# Patient Record
Sex: Female | Born: 1980 | Race: Black or African American | Hispanic: No | Marital: Single | Smoking: Current every day smoker
Health system: Southern US, Community
[De-identification: ages and names within clinical notes are randomized; demographics above are authoritative.]

## PROBLEM LIST (undated history)

## (undated) DIAGNOSIS — K859 Acute pancreatitis without necrosis or infection, unspecified: Secondary | ICD-10-CM

## (undated) DIAGNOSIS — J939 Pneumothorax, unspecified: Secondary | ICD-10-CM

## (undated) HISTORY — PX: LUNG SURGERY: SHX703

---

## 2004-10-23 ENCOUNTER — Observation Stay: Payer: Self-pay | Admitting: Obstetrics & Gynecology

## 2004-11-09 ENCOUNTER — Inpatient Hospital Stay: Payer: Self-pay

## 2005-03-20 ENCOUNTER — Emergency Department: Payer: Self-pay | Admitting: General Practice

## 2006-02-16 ENCOUNTER — Emergency Department: Payer: Self-pay | Admitting: General Practice

## 2008-07-17 ENCOUNTER — Encounter: Payer: Self-pay | Admitting: Maternal & Fetal Medicine

## 2008-07-31 ENCOUNTER — Encounter: Payer: Self-pay | Admitting: Maternal and Fetal Medicine

## 2008-10-23 ENCOUNTER — Encounter: Payer: Self-pay | Admitting: Maternal & Fetal Medicine

## 2008-10-26 ENCOUNTER — Encounter: Payer: Self-pay | Admitting: Maternal & Fetal Medicine

## 2008-10-30 ENCOUNTER — Encounter: Payer: Self-pay | Admitting: Obstetrics & Gynecology

## 2008-11-06 ENCOUNTER — Encounter: Payer: Self-pay | Admitting: Obstetrics & Gynecology

## 2008-11-09 ENCOUNTER — Observation Stay: Payer: Self-pay

## 2008-11-11 ENCOUNTER — Inpatient Hospital Stay: Payer: Self-pay | Admitting: Unknown Physician Specialty

## 2010-06-17 ENCOUNTER — Emergency Department: Payer: Self-pay | Admitting: Emergency Medicine

## 2011-01-10 ENCOUNTER — Ambulatory Visit: Payer: Self-pay | Admitting: Family Medicine

## 2011-04-22 ENCOUNTER — Emergency Department: Payer: Self-pay | Admitting: Emergency Medicine

## 2011-08-14 ENCOUNTER — Encounter: Payer: Self-pay | Admitting: Obstetrics & Gynecology

## 2011-09-25 ENCOUNTER — Encounter: Payer: Self-pay | Admitting: Obstetrics and Gynecology

## 2011-12-12 ENCOUNTER — Inpatient Hospital Stay: Payer: Self-pay

## 2011-12-12 LAB — CBC WITH DIFFERENTIAL/PLATELET
Basophil #: 0 10*3/uL (ref 0.0–0.1)
Basophil %: 0.2 %
Eosinophil #: 0.1 10*3/uL (ref 0.0–0.7)
Eosinophil %: 0.5 %
HCT: 34.8 % — ABNORMAL LOW (ref 35.0–47.0)
HGB: 11.8 g/dL — ABNORMAL LOW (ref 12.0–16.0)
Lymphocyte #: 2 10*3/uL (ref 1.0–3.6)
Lymphocyte %: 8.9 %
MCH: 32.6 pg (ref 26.0–34.0)
MCHC: 33.9 g/dL (ref 32.0–36.0)
MCV: 96 fL (ref 80–100)
Monocyte #: 0.8 10*3/uL — ABNORMAL HIGH (ref 0.0–0.7)
Monocyte %: 3.8 %
Neutrophil #: 19.2 10*3/uL — ABNORMAL HIGH (ref 1.4–6.5)
Neutrophil %: 86.6 %
Platelet: 403 10*3/uL (ref 150–440)
RBC: 3.61 10*6/uL — ABNORMAL LOW (ref 3.80–5.20)
RDW: 13.3 % (ref 11.5–14.5)
WBC: 22.2 10*3/uL — ABNORMAL HIGH (ref 3.6–11.0)

## 2011-12-13 LAB — HEMATOCRIT: HCT: 28.8 % — ABNORMAL LOW (ref 35.0–47.0)

## 2011-12-16 LAB — PATHOLOGY REPORT

## 2012-05-01 ENCOUNTER — Emergency Department: Payer: Self-pay | Admitting: Emergency Medicine

## 2012-05-01 LAB — CBC
HCT: 38.3 % (ref 35.0–47.0)
MCH: 30.5 pg (ref 26.0–34.0)
MCHC: 33.1 g/dL (ref 32.0–36.0)
MCV: 92 fL (ref 80–100)
Platelet: 362 10*3/uL (ref 150–440)
RDW: 13.9 % (ref 11.5–14.5)

## 2012-05-01 LAB — COMPREHENSIVE METABOLIC PANEL
Albumin: 3.7 g/dL (ref 3.4–5.0)
Anion Gap: 5 — ABNORMAL LOW (ref 7–16)
Bilirubin,Total: 0.3 mg/dL (ref 0.2–1.0)
Chloride: 109 mmol/L — ABNORMAL HIGH (ref 98–107)
Co2: 25 mmol/L (ref 21–32)
EGFR (African American): >60
Osmolality: 279 (ref 275–301)
Potassium: 4 mmol/L (ref 3.5–5.1)
SGOT(AST): 20 U/L (ref 15–37)
SGPT (ALT): 29 U/L (ref 12–78)
Sodium: 139 mmol/L (ref 136–145)
Total Protein: 7.7 g/dL (ref 6.4–8.2)

## 2012-05-01 LAB — URINALYSIS, COMPLETE
Bacteria: NONE SEEN
Bilirubin,UR: NEGATIVE
Blood: NEGATIVE
Glucose,UR: NEGATIVE mg/dL (ref 0–75)
Ketone: NEGATIVE
Leukocyte Esterase: NEGATIVE
Nitrite: NEGATIVE
Ph: 7 (ref 4.5–8.0)
RBC,UR: 1 /HPF (ref 0–5)
Squamous Epithelial: 1
WBC UR: 1 /HPF (ref 0–5)

## 2012-10-31 ENCOUNTER — Inpatient Hospital Stay: Payer: Self-pay | Admitting: Cardiothoracic Surgery

## 2012-10-31 LAB — BASIC METABOLIC PANEL
Anion Gap: 5 — ABNORMAL LOW (ref 7–16)
BUN: 9 mg/dL (ref 7–18)
Chloride: 110 mmol/L — ABNORMAL HIGH (ref 98–107)
EGFR (African American): >60
Osmolality: 281 (ref 275–301)
Potassium: 3.9 mmol/L (ref 3.5–5.1)
Sodium: 142 mmol/L (ref 136–145)

## 2012-10-31 LAB — CBC
HGB: 12.5 g/dL (ref 12.0–16.0)
MCH: 30.2 pg (ref 26.0–34.0)
MCV: 95 fL (ref 80–100)
RBC: 4.14 10*6/uL (ref 3.80–5.20)

## 2012-10-31 LAB — TROPONIN I: Troponin-I: <0.02 ng/mL

## 2012-11-05 ENCOUNTER — Ambulatory Visit: Payer: Self-pay | Admitting: Cardiothoracic Surgery

## 2012-11-08 ENCOUNTER — Ambulatory Visit: Payer: Self-pay | Admitting: Cardiothoracic Surgery

## 2012-11-10 ENCOUNTER — Ambulatory Visit: Payer: Self-pay | Admitting: Cardiothoracic Surgery

## 2012-11-12 LAB — CBC
HCT: 39.5 % (ref 35.0–47.0)
HGB: 13.7 g/dL (ref 12.0–16.0)
MCH: 32.7 pg (ref 26.0–34.0)
MCHC: 34.8 g/dL (ref 32.0–36.0)
MCV: 94 fL (ref 80–100)
RBC: 4.21 10*6/uL (ref 3.80–5.20)
RDW: 13 % (ref 11.5–14.5)
WBC: 9.9 10*3/uL (ref 3.6–11.0)

## 2012-11-12 LAB — CK TOTAL AND CKMB (NOT AT ARMC): CK-MB: <0.5 ng/mL — ABNORMAL LOW (ref 0.5–3.6)

## 2012-11-12 LAB — COMPREHENSIVE METABOLIC PANEL
Alkaline Phosphatase: 85 U/L (ref 50–136)
Anion Gap: 4 — ABNORMAL LOW (ref 7–16)
BUN: 13 mg/dL (ref 7–18)
Bilirubin,Total: 0.5 mg/dL (ref 0.2–1.0)
Chloride: 108 mmol/L — ABNORMAL HIGH (ref 98–107)
Co2: 26 mmol/L (ref 21–32)
Creatinine: 0.94 mg/dL (ref 0.60–1.30)
Glucose: 73 mg/dL (ref 65–99)
Potassium: 4.3 mmol/L (ref 3.5–5.1)
SGOT(AST): 15 U/L (ref 15–37)
SGPT (ALT): 21 U/L (ref 12–78)
Sodium: 138 mmol/L (ref 136–145)
Total Protein: 8.5 g/dL — ABNORMAL HIGH (ref 6.4–8.2)

## 2012-11-13 ENCOUNTER — Inpatient Hospital Stay: Payer: Self-pay | Admitting: Surgery

## 2012-11-16 LAB — BASIC METABOLIC PANEL
Anion Gap: 7 (ref 7–16)
BUN: 5 mg/dL — ABNORMAL LOW (ref 7–18)
Calcium, Total: 7.8 mg/dL — ABNORMAL LOW (ref 8.5–10.1)
Chloride: 103 mmol/L (ref 98–107)
Co2: 26 mmol/L (ref 21–32)
Creatinine: 0.82 mg/dL (ref 0.60–1.30)
EGFR (African American): >60
EGFR (Non-African Amer.): >60
Glucose: 196 mg/dL — ABNORMAL HIGH (ref 65–99)
Sodium: 136 mmol/L (ref 136–145)

## 2012-11-16 LAB — CBC WITH DIFFERENTIAL/PLATELET
Basophil #: 0 10*3/uL (ref 0.0–0.1)
Basophil %: 0.4 %
Eosinophil %: 3.5 %
HCT: 33.4 % — ABNORMAL LOW (ref 35.0–47.0)
HGB: 11.3 g/dL — ABNORMAL LOW (ref 12.0–16.0)
MCH: 32 pg (ref 26.0–34.0)
MCV: 95 fL (ref 80–100)
Monocyte #: 0.8 x10 3/mm (ref 0.2–0.9)
Monocyte %: 7.8 %
Neutrophil %: 67.8 %

## 2012-11-17 LAB — URINE CULTURE

## 2012-11-19 LAB — PATHOLOGY REPORT

## 2012-11-22 ENCOUNTER — Ambulatory Visit: Payer: Self-pay | Admitting: Cardiothoracic Surgery

## 2012-12-27 ENCOUNTER — Ambulatory Visit: Payer: Self-pay | Admitting: Cardiothoracic Surgery

## 2014-12-29 NOTE — Op Note (Signed)
PATIENT NAME:  Maureen [name], Maureen [name] MR#:  [id number] DATE OF BIRTH:  May 05, 1981  DATE OF PROCEDURE:  10/31/2012  PREOPERATIVE DIAGNOSIS: Spontaneous right pneumothorax.   POSTOPERATIVE DIAGNOSIS:  Spontaneous right pneumothorax.    SURGEON: Claude MangesWilliam F Lilou Kneip, M.D.   ANESTHESIA: 10 mg IV morphine and 2 mL of lidocaine with epinephrine local.   PROCEDURE PERFORMED:  Closed right thoracostomy tube placement (9.0 JamaicaFrench).   PROCEDURE IN DETAIL: The patient was prepped and draped in the usual sterile fashion. A small incision was made over the seventh rib and the subcutaneous tunnel was created to the superior border of that rib in the anterior axillary line and closed thoracostomy tube was placed into the pleura and guided superiorly to a point where it was just a centimeter or 2 below where the patient experienced some shoulder and high back pain. It was sutured in place with a 0 silk suture and placed to 20 cm water suction and as the patient coughed, she re-expanded her lung as determined by postoperative chest x-ray. There were no complications.    ____________________________ Claude MangesWilliam F. Annasofia Pohl, MD wfm:cc D: 10/31/2012 17:21:25 ET T: 10/31/2012 20:52:44 ET JOB#: [id number]  cc: Claude MangesWilliam F. Shalae Belmonte, MD, <Dictator> Claude MangesWILLIAM F Xavian Hardcastle MD ELECTRONICALLY SIGNED 11/08/2012 20:45

## 2014-12-29 NOTE — H&P (Signed)
PATIENT NAME:  Maureen [name], Maureen [name] MR#:  [id number] DATE OF BIRTH:  05-24-1981  DATE OF ADMISSION:  10/31/2012  HISTORY OF PRESENT ILLNESS:  The patient is a 34 year old black female who was walking back from the store this afternoon and noticed right-sided chest pain and acute shortness of breath. She did not have any other symptoms including hemoptysis or fever.   PAST MEDICAL HISTORY:  No medical illnesses.   MEDICATIONS:  None.   ALLERGIES:  None.   PAST SURGICAL HISTORY:  C-section.   SOCIAL HISTORY:  The patient is single and lives at home with her 2 children. She has a boyfriend. 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 She does not drink any appreciable alcohol.   FAMILY HISTORY:  Noncontributory.   REVIEW OF SYSTEMS:  Negative for 10 systems except as mentioned in the history of present illness above.   PHYSICAL EXAMINATION:   GENERAL: Reveals a skinny young black female lying comfortably on the Emergency Department stretcher. Height 5 feet 5 inches, weight 135 pounds, BMI 22.5.  VITAL SIGNS: Temperature 98.5, pulse 88, respirations 18, blood pressure 117/81, oxygen saturation 97% on room air at rest.  HEENT: Pupils equally round and reactive to light. Extraocular movements intact. Sclerae anicteric. Oropharynx clear. Mucous membranes moist.  NECK: Supple with no tracheal deviation or jugular venous distention and there is no lymphadenopathy.  HEART: Regular rate and rhythm with no murmurs or rubs.  LUNGS: Diminished breath sounds on the right side and the left side is clear.  ABDOMEN: Soft, nontender, nondistended with no palpable hepatosplenomegaly or other masses.  EXTREMITIES: No edema with normal capillary refill bilaterally.  NEUROLOGIC: Cranial nerves II through XII, motor and sensation grossly intact.  PSYCHIATRIC: Alert and oriented x 4. Appropriate affect.   OTHER STUDIES:  CBC and electrolytes and troponin all normal. Chest x-ray  reviewed by me reveals a near total right pneumothorax with no rib fracture or any significant pleural fluid. There is no mediastinal shift.   ASSESSMENT:  Spontaneous right pneumothorax.   PLAN:  Closed thoracostomy tube placement and admission to the hospital.    ____________________________ Claude MangesWilliam F. Leesa Leifheit, MD wfm:si D: 10/31/2012 17:19:14 ET T: 10/31/2012 18:12:32 ET JOB#: [id number]  cc: Claude MangesWilliam F. Shandora Koogler, MD, <Dictator> Claude MangesWILLIAM F Lannie Yusuf MD ELECTRONICALLY SIGNED 11/08/2012 20:44

## 2014-12-29 NOTE — Discharge Summary (Signed)
PATIENT NAME:  Maureen DeltonFLUELLING, Juanette MR#:  [id number] DATE OF BIRTH:  Mar 21, 1981  DATE OF ADMISSION:  10/31/2012 DATE OF DISCHARGE:  11/03/2012  ADMITTING DIAGNOSIS:  Right-sided spontaneous pneumothorax.   DISCHARGE DIAGNOSIS:  Right-sided spontaneous pneumothorax.  OPERATION PERFORMED:  Insertion of right-sided chest tube.   HOSPITAL COURSE: Ms. Buelah ManisFluelling is a 34 year old African American woman who is in her usual state of health when she experienced the acute onset of severe right-sided chest pain and shortness of breath. She was brought to the Emergency Department where a large pneumothorax was identified and was managed with a percutaneous chest tube. The patient did have an air leak for the first day but on the 25th of February she did not have an air leak and her chest tube was placed to water seal. On the 26th of February, her chest tube was removed and a follow-up x-ray showed no evidence of pneumothorax.   The patient was not taking any medications at the time of admission to the hospital. She was given a prescription for Percocet 1 to 2 tablets every 4 to 6 hours as needed for pain, number 20.  She will return to see me in 1 week. She will remove her dressings in 48 hours and cleanse over the incision with soap and water.    ____________________________ Sheppard Plumberimothy E. Thelma Bargeaks, MD teo:ce D: 11/03/2012 16:34:30 ET T: 11/03/2012 18:11:56 ET JOB#: [id number]  cc: Marcial Pacasimothy E. Thelma Bargeaks, MD, <Dictator> Jasmine DecemberIMOTHY E Nyaisha Simao MD ELECTRONICALLY SIGNED 11/04/2012 17:36

## 2014-12-29 NOTE — Op Note (Signed)
PATIENT NAME:  Maureen CantorFLUELLING, Yi A MR#:  [id number] DATE OF BIRTH:  18-Mar-1981  DATE OF PROCEDURE:  11/15/2012  SURGEON: Dr. Hulda Marinimothy Laryn Venning.   ASSISTANT: Dr. Natale LayMark Bird.  PREOPERATIVE DIAGNOSIS: Recurrent pneumothorax.   POSTOPERATIVE DIAGNOSIS: Recurrent pneumothorax.   OPERATION PERFORMED:  1.  Preoperative bronchoscopy to assess endobronchial anatomy.  2.  Right thoracoscopy with wedge resection right upper lobe, inferior aspect right upper lobe and superior segment right lower lobe.   3.  Mechanical pleurodesis.   INDICATIONS FOR PROCEDURE: This patient is a 34 year old woman, who presented to the Emergency Department with a spontaneous pneumothorax several weeks ago. She was managed nonoperatively, but came in several days ago with another pneumothorax. The indications and risks of the above-named procedures were explained to the patient, who gave her informed consent.   DESCRIPTION OF PROCEDURE: The patient was brought to the operating suite and placed in the supine position. General endotracheal anesthesia was given through a double-lumen tube. Preoperative bronchoscopy was carried out. This was normal to the subsegmental levels bilaterally. The patient was then turned for a right thoracoscopy. All pressure points were carefully padded. The patient was prepped and draped in the usual sterile fashion. The previous chest tube had been removed. The chest was entered through an incision made anteriorly just behind the right breast. This was deepened down through the muscles of the chest wall until the pleural space was entered. Once the pleural space was entered, we could then place the scope and at placed our other 2 incisions under direct visualization. The camera port was the most inferior and posterior to the tip of the scapula another port was created. Through these 3 working ports, we were able to examine the lung in its entirety. Of note, is that there was no evidence of any diaphragmatic  defects. There was no purulence in the chest. There were some scattered adhesions to the apex of the chest, which appeared to be from her prior chest tube. Once the lung was completely freed up, we then looked. At the very apex of the chest, there were some adhesions. Whether these were from the chest tube or from ruptured blebs was unclear, but this area was excised using a stapler. We did find another bleb on the inferior aspect of the right lower lobe at its junction with the right middle lobe and we found a third area in the superior segment of the right lower lobe. All of these were removed with an endoscopic stapler.  We then used a Bovie scratch pad to perform a mechanical pleurodesis. The chest was then irrigated and hemostasis was complete. A single chest tube was inserted through the most inferior port and brought out through a separate stab wound inferiorly. The tube was secured to the skin with silk. The 3 port sites were then closed with 0 Vicryl on the muscle, 2-0 Vicryl on the subcutaneous layers and nylon on the skin. The patient was awakened from general endotracheal anesthesia where she was then taken to the recovery room after extubation.   ____________________________ Sheppard Plumberimothy E. Thelma Bargeaks, MD teo:aw D: 11/15/2012 17:16:29 ET T: 11/16/2012 09:16:49 ET JOB#: [id number]  cc: Marcial Pacasimothy E. Thelma Bargeaks, MD, <Dictator> Jasmine DecemberIMOTHY E Nyjah Denio MD ELECTRONICALLY SIGNED 11/22/2012 7:53

## 2014-12-29 NOTE — Discharge Summary (Signed)
PATIENT NAME:  Maureen [name], Maureen [name] MR#:  [id number] DATE OF BIRTH:  [birth year]  DATE OF ADMISSION:  11/13/2012 DATE OF DISCHARGE:  11/18/2012  BRIEF HISTORY:  The patient is [name] 34 year old woman seen in the Emergency Room with chest pain and mild shortness of breath. Evaluation in the Emergency Room suggested [name] right-sided pneumothorax. She had been discharged from the hospital on February 26th following [name] 4-day admission for spontaneous right pneumothorax. Her symptoms recurred and she was brought back to the hospital. She had been treated with [name] 9 French chest tube and suction which seemed to solve the problem, but her symptoms have recurred. [name] large chest tube was placed on the late evening of March 8th using [name] 20 [place]French tube. The following day, she had minimal air leak, but almost complete reexpansion. Dr. Westley Gamblesim Oaks saw the patient on March 10th and felt the patient would benefit from pleurodesis and possible wedge resection. The patient was taken to surgery on March 10th where she underwent thoracoscopy, wedge resection and mechanical pleurodesis. Her symptoms improved over the next 24 hours and discharged home on March 13th to be followed in the office in 7 to 10 days' time. Bathing, activity and driving instructions were given to the patient.   DISCHARGE MEDICATIONS:  Percocet 5/325 mg 1 to 2 tablets every 4 to 6 hours p.r.n.   FINAL DISCHARGE DIAGNOSIS:  Recurrent pneumothorax.   SURGERY:  Thoracoscopy, wedge resection and pleurodesis.    ____________________________ Carmie Endalph L. Ely III, MD rle:si D: 11/25/2012 14:54:00 ET T: 11/25/2012 15:03:51 ET JOB#: [id number]  cc: Quentin Orealph L. Ely III, MD, <Dictator> Timothy E. Thelma Bargeaks, MD Quentin OreALPH L ELY MD ELECTRONICALLY SIGNED 11/26/2012 16:27

## 2014-12-29 NOTE — H&P (Signed)
PATIENT NAME:  Maureen [name], [name] MR#:  [id number] DATE OF BIRTH:  12/25/1980  DATE OF ADMISSION:  11/13/2012  PRIMARY CARE PHYSICIAN:  None.   ADMITTING PHYSICIAN:  Dr. Michela PitcherEly.   CHIEF COMPLAINT:  Chest pain, shortness of breath.   BRIEF HISTORY OF PRESENT ILLNESS:  The patient is a 34 year old woman seen in the Emergency Room with a several hour history of recurrent right chest pain and mild shortness of breath.  She was recently discharged from the hospital on February 26th following a 4 day admission for a spontaneous right pneumothorax.  She had some right-sided chest pain, acute shortness of breath and chest x-ray demonstrated significant near-complete pneumothorax on the right side.  She had never had a previous problem.  She was treated with a 9-French chest tube and suction.  Her symptoms improved and her chest x-ray demonstrated resolution of the pneumothorax.  She developed recurrence of symptoms this afternoon, presented to the Emergency Room where she had a partial pneumothorax noted on chest x-ray.  The surgical service was consulted.   PAST MEDICAL HISTORY:  Significant only for C-section.  She has had an abortion in addition.  Denies any medical illness other than a previous history of hepatitis.  MEDICATIONS:  She takes no medications regularly.   ALLERGIES:  There is no medical allergies.    SOCIAL HISTORY:  She does smoke about approximately a half pack of cigarettes a day.  Does not drink a significant amount of alcohol.   REVIEW OF SYSTEMS:  Unremarkable.   PHYSICAL EXAMINATION: GENERAL:  She is a pleasant relatively comfortable woman lying on the stretcher.  VITAL SIGNS:  Heart rate 77 and regular.  She is afebrile.  Blood pressure is 126/80, oxygen saturation 98% and her pain level is a 2.  HEENT:  Examination is unremarkable.  She has no scleral icterus.  No facial deformities.  No pupillary abnormalities.  NECK:  Supple, nontender with no adenopathy.  Midline trachea.   CHEST:  Clear bilaterally, but she definitely has decreased breath sounds on the right side.  She has normal pulmonary excursion.  CARDIAC:  Reveals no murmurs or gallops.  She is to be in normal sinus rhythm to my examination.  ABDOMEN:  Benign with no tenderness.  No rebound.  No guarding.  No masses.  No hernias.  LOWER EXTREMITIES:  Reveals full range of motion.  No deformities.  PSYCHIATRIC:  Normal orientation, affect.   ASSESSMENT AND PLAN:  I have independently reviewed her chest x-ray.  She does have a partial recurrent right pneumothorax.  Discussed the options with her in detail.  She has elected to proceed with placement of a larger chest tube.      ____________________________ Carmie Endalph L. Ely III, MD rle:ea D: 11/13/2012 01:55:11 ET T: 11/13/2012 03:03:53 ET JOB#: [id number]  cc: Carmie Endalph L. Ely III, MD, <Dictator> Quentin OreALPH L ELY MD ELECTRONICALLY SIGNED 11/14/2012 19:41

## 2014-12-29 NOTE — Op Note (Signed)
PATIENT NAME:  Maureen CantorFLUELLING, [name] MR#:  [id number] DATE OF BIRTH:  [birth year]  DATE OF PROCEDURE:  11/13/2012  PREOPERATIVE DIAGNOSIS:  Recurrent spontaneous pneumothorax.   POSTOPERATIVE DIAGNOSIS:  Recurrent spontaneous pneumothorax.   PROCEDURE:  Right tube thoracostomy.   ANESTHESIA:  Local with sedation.   SURGEON:  Quentin Orealph L. Ely, MD.   OPERATIVE PROCEDURE:  With the patient in the supine position, the patient was placed in the right side up after using some valium sedation. The patient's right chest was prepped with Betadine and draped with sterile towels. At the anterior axillary line an incision was made after anesthetizing the skin with 0.25% Marcaine. Approximately 27 mL of Marcaine were utilized. A small incision was made over the palpable rib, and a 20-French chest tube inserted over the rib into the apex of the lung. An immediate return of air was noted. The tube was secured with 3-0 silk and sutured to the chest wall without difficulty. It was secured to a Pleur-evac and redressed sterilely. A chest x-ray is pending.  ____________________________ Carmie Endalph L. Ely III, MD rle:jm D: 11/13/2012 01:56:38 ET T: 11/13/2012 12:03:41 ET JOB#: [id number]  cc: Quentin Orealph L. Ely III, MD, <Dictator> Quentin OreALPH L ELY MD ELECTRONICALLY SIGNED 11/14/2012 19:41

## 2014-12-31 NOTE — Op Note (Signed)
PATIENT NAME:  Maureen [name], Maureen [name] MR#:  [id number] DATE OF BIRTH:  Apr 25, 1981  DATE OF PROCEDURE:  12/12/2011  PREOPERATIVE DIAGNOSES:  Intrauterine pregnancy, term, previous cesarean section, failure to progress, desires sterilization.   POSTOPERATIVE DIAGNOSES:  Intrauterine pregnancy, term, previous cesarean section, failure to progress, desires sterilization, uterine rupture.   PROCEDURE PERFORMED: Low transverse cesarean section, repair of uterine rupture, bilateral partial salpingectomy.   SURGEON: Towana BadgerPhilip J Rosenow, MD   OPERATIVE FINDINGS:  Upon entering the pelvis a large apparent collection of amniotic fluid was under the peritoneum. Upon opening this a 5-cm rupture of the lower uterine segment was encountered. This was extended laterally with some difficulty. A 7-pound 6-ounce female infant was delivered at 5213: 5549.  The placenta was removed manually. It was exceedingly difficult to perceive in the pelvis what was uterus, bladder, amniotic fluid, etc. The lower uterine segment was closed with three sutures of chromic zero. All pertinent areas of surgery were inspected and found to be hemostatic. It should be throughout that the urine was clear. A bilateral partial salpingectomy was then performed with tubes being doubly ligated with a plain suture and a portion removed. The pelvis was lavaged with copious amounts of saline. The rectus muscles were reapproximated in the midline. The fascia was reapproximated with continuous suture of Maxon and the skin was closed with skin staples. Estimated blood loss was 1000 mL.  The patient tolerated the procedure well and left the operating room in good condition. Sponge and needle counts  were said to be correct at the end of procedure. ____________________________ Deloris PingPhilip J. Luella Cookosenow, MD pjr:bjt D: 12/12/2011 14:25:17 ET T: 12/12/2011 17:34:38 ET JOB#: [id number]  cc: Deloris PingPhilip J. Luella Cookosenow, MD, <Dictator> Towana BadgerPHILIP J ROSENOW MD ELECTRONICALLY SIGNED 12/30/2011  18:53

## 2015-01-16 NOTE — H&P (Signed)
L&D Evaluation:  History Expanded:   HPI 34 yo G3P2002 whose EDC = 12/16/11 Pt's second pregnancy terminated via LTCS for breedh.  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    Patient's Surgical History none    Medications Pre Natal Vitamins    Allergies NKDA    Current Prenatal Course Notable For Hep B carrier   Exam:   Vital Signs stable    Mental Status clear    Chest clear    Heart normal sinus rhythm    Abdomen gravid, tender with contractions    Estimated Fetal Weight Average for gestational age    Pelvic fully dilated    Mebranes Intact    FHT normal rate with no decels   Impression:   Impression active labor, VBAC, Desires PPTL   Electronic Signatures: Towana Badgerosenow, Philip J (MD)  (Signed 05-Apr-13 10:28)  Authored: L&D Evaluation   Last Updated: 05-Apr-13 10:28 by Towana Badgerosenow, Philip J (MD)

## 2015-10-16 ENCOUNTER — Emergency Department
Admission: EM | Admit: 2015-10-16 | Discharge: 2015-10-16 | Disposition: A | Discharge status: Home / Self Care | Payer: Medicaid Other | Attending: Emergency Medicine | Admitting: Emergency Medicine

## 2015-10-16 ENCOUNTER — Emergency Department: Payer: Medicaid Other

## 2015-10-16 ENCOUNTER — Encounter: Payer: Self-pay | Admitting: *Deleted

## 2015-10-16 DIAGNOSIS — R0602 Shortness of breath: Secondary | ICD-10-CM | POA: Diagnosis not present

## 2015-10-16 DIAGNOSIS — F17200 Nicotine dependence, unspecified, uncomplicated: Secondary | ICD-10-CM | POA: Insufficient documentation

## 2015-10-16 DIAGNOSIS — K859 Acute pancreatitis without necrosis or infection, unspecified: Secondary | ICD-10-CM | POA: Insufficient documentation

## 2015-10-16 DIAGNOSIS — R05 Cough: Secondary | ICD-10-CM | POA: Diagnosis not present

## 2015-10-16 DIAGNOSIS — R109 Unspecified abdominal pain: Secondary | ICD-10-CM

## 2015-10-16 DIAGNOSIS — R1011 Right upper quadrant pain: Secondary | ICD-10-CM | POA: Diagnosis present

## 2015-10-16 LAB — CBC WITH DIFFERENTIAL/PLATELET
Basophils Absolute: 0.1 10*3/uL (ref 0–0.1)
Basophils Relative: 1 %
Eosinophils Absolute: 0.3 10*3/uL (ref 0–0.7)
Eosinophils Relative: 4 %
HEMATOCRIT: 37.2 % (ref 35.0–47.0)
HEMOGLOBIN: 12.4 g/dL (ref 12.0–16.0)
LYMPHS ABS: 2.9 10*3/uL (ref 1.0–3.6)
LYMPHS PCT: 30 %
MCH: 30.4 pg (ref 26.0–34.0)
MCHC: 33.4 g/dL (ref 32.0–36.0)
MCV: 91 fL (ref 80.0–100.0)
MONOS PCT: 11 %
Monocytes Absolute: 1.1 10*3/uL — ABNORMAL HIGH (ref 0.2–0.9)
NEUTROS ABS: 5.3 10*3/uL (ref 1.4–6.5)
NEUTROS PCT: 54 %
Platelets: 387 10*3/uL (ref 150–440)
RBC: 4.08 MIL/uL (ref 3.80–5.20)
RDW: 13.4 % (ref 11.5–14.5)
WBC: 9.7 10*3/uL (ref 3.6–11.0)

## 2015-10-16 LAB — BASIC METABOLIC PANEL
ANION GAP: 6 (ref 5–15)
BUN: 14 mg/dL (ref 6–20)
CHLORIDE: 108 mmol/L (ref 101–111)
CO2: 26 mmol/L (ref 22–32)
Calcium: 9.1 mg/dL (ref 8.9–10.3)
Creatinine, Ser: 0.96 mg/dL (ref 0.44–1.00)
GFR calc non Af Amer: >60 mL/min (ref >60)
GLUCOSE: 95 mg/dL (ref 65–99)
POTASSIUM: 3.4 mmol/L — AB (ref 3.5–5.1)
Sodium: 140 mmol/L (ref 135–145)

## 2015-10-16 LAB — FIBRIN DERIVATIVES D-DIMER (ARMC ONLY): FIBRIN DERIVATIVES D-DIMER (ARMC): 455 (ref 0–499)

## 2015-10-16 LAB — LIPASE, BLOOD: Lipase: 60 U/L — ABNORMAL HIGH (ref 11–51)

## 2015-10-16 LAB — TROPONIN I: Troponin I: <0.03 ng/mL (ref <0.031)

## 2015-10-16 MED ORDER — ONDANSETRON 4 MG PO TBDP: 4.0000 mg | ORAL_TABLET | Freq: Three times a day (TID) | ORAL | Status: AC | PRN

## 2015-10-16 MED ORDER — OXYCODONE-ACETAMINOPHEN 5-325 MG PO TABS
1.0000 | ORAL_TABLET | Freq: Once | ORAL | Status: AC
  Administered 2015-10-16: 1 via ORAL
  Filled 2015-10-16: qty 1

## 2015-10-16 MED ORDER — OXYCODONE-ACETAMINOPHEN 5-325 MG PO TABS: 1.0000 | ORAL_TABLET | Freq: Four times a day (QID) | ORAL | Status: AC | PRN

## 2015-10-16 NOTE — ED Provider Notes (Signed)
[facility] Emergency Department Provider Note  ____________________________________________  Time seen: Approximately 601 AM  I have reviewed the triage vital signs and the nursing notes.   HISTORY  Chief Complaint Respiratory Distress    HPI Maureen [name] is a 35 y.o. female who comes into the hospital today with difficulty breathing. She feels as though she is not breathing right. The patient reports that she had some vomiting today and reports that whenever she breathes she feels as though his pressure and a right upper quadrant or her lower chest. The patient reports that her discomfort is a 6 out of 10 in intensity. She reports that when she vomited today it was food and then clear. She's had a mild cough with no fevers no headache no blurred vision. She has had some mild low back pain as well. The patient took some DayQuil yesterday and thinks it could've been related to her symptoms. The patient was unsure but not feel comfortable due to her difficulty breathing so wanted to come into the hospital and get checked out. The patient has had a lung collapse in the past and reports that she had lung surgery due to that lung collapse.   No past medical history   There are no active problems to display for this patient.   Past surgical history Lung surgery  No current outpatient prescriptions  Allergies Review of patient's allergies indicates no known allergies.  No family history on file.  Social History Social History  Substance Use Topics  . Smoking status: Current Every Day Smoker  . Smokeless tobacco: Not on file  . Alcohol Use: No    Review of Systems Constitutional: No fever/chills Eyes: No visual changes. ENT: No sore throat. Cardiovascular: Denies chest pain. Respiratory:  shortness of breath. Gastrointestinal: Upper abdominal pain and vomiting Genitourinary: Negative for dysuria. Musculoskeletal: Negative for back pain. Skin:  Negative for rash. Neurological: Negative for headaches, focal weakness or numbness.  10-point ROS otherwise negative.  ____________________________________________   PHYSICAL EXAM:  VITAL SIGNS: ED Triage Vitals  Enc Vitals Group     BP 10/16/15 0024 131/85 mmHg     Pulse Rate 10/16/15 0024 83     Resp 10/16/15 0024 20     Temp 10/16/15 0024 98.2 F (36.8 C)     Temp Source 10/16/15 0024 Oral     SpO2 10/16/15 0024 100 %     Weight 10/16/15 0024 150 lb (68.04 kg)     Height 10/16/15 0024  (1.626 m)     Head Cir --      Peak Flow --      Pain Score 10/16/15 0526 0     Pain Loc --      Pain Edu? --      Excl. in GC? --     Constitutional: Alert and oriented. Well appearing and in mild distress. Eyes: Conjunctivae are normal. PERRL. EOMI. Head: Atraumatic. Nose: No congestion/rhinnorhea. Mouth/Throat: Mucous membranes are moist.  Oropharynx non-erythematous. Cardiovascular: Normal rate, regular rhythm. Grossly normal heart sounds.  Good peripheral circulation. Respiratory: Normal respiratory effort.  No retractions. Lungs CTAB. Gastrointestinal: Soft and nontender. No distention. Positive bowel sounds Musculoskeletal: No lower extremity tenderness nor edema.   Neurologic:  Normal speech and language.  Skin:  Skin is warm, dry and intact.  Psychiatric: Mood and affect are normal.   ____________________________________________   LABS (all labs ordered are listed, but only abnormal results are displayed)  Labs Reviewed  BASIC  METABOLIC PANEL - Abnormal; Notable for the following:    Potassium 3.4 (*)    All other components within normal limits  CBC WITH DIFFERENTIAL/PLATELET - Abnormal; Notable for the following:    Monocytes Absolute 1.1 (*)    All other components within normal limits  LIPASE, BLOOD - Abnormal; Notable for the following:    Lipase 60 (*)    All other components within normal limits  TROPONIN I  FIBRIN DERIVATIVES D-DIMER ([facility] ONLY)    ____________________________________________  EKG  ED ECG REPORT I, Rebecka Apley, the attending physician, personally viewed and interpreted this ECG.   Date: 10/16/2015  EKG Time: 0031  Rate: 78  Rhythm: normal sinus rhythm  Axis: normal  Intervals:none  ST&T Change: none  ____________________________________________  RADIOLOGY  CXR: No acute cardiopulmonary process seen  Right upper quadrant abdominal ultrasound: Normal right upper quadrant ultrasound ____________________________________________   PROCEDURES  Procedure(s) performed: None  Critical Care performed: No  ____________________________________________   INITIAL IMPRESSION / ASSESSMENT AND PLAN / ED COURSE  Pertinent labs & imaging results that were available during my care of the patient were reviewed by me and considered in my medical decision making (see chart for details).  This is a 35 year old who comes into the hospital with SOB and RUQ pain. The patient reports that she does have some increased discomfort when she takes a deep breath in but I will do an ultrasound of her right upper quadrant as well as a d-dimer and a lipase. I will reassess the patient once I received the results of her imaging.  It appears as though the patient has some mildly elevated lipase which is consistent with pancreatitis. The patient is not vomiting at this time although she reports that she did have some vomiting earlier. I will give the patient a dose of Percocet to help with her discomfort and pain. The patient's chest x-ray though and maintain blood work is unremarkable. I feel it is appropriate to discharge the patient as her lipase is not severely elevated nor does she have an elevated white blood cell count. I discussed this with the patient and she understands and agrees with the plan as stated. She will be discharged to home. ____________________________________________   FINAL CLINICAL IMPRESSION(S) / ED  DIAGNOSES  Final diagnoses:  Abdominal pain  Acute pancreatitis, unspecified pancreatitis type  Shortness of breath      Rebecka Apley, MD 10/16/15 [id number]

## 2015-10-16 NOTE — ED Notes (Addendum)
Pt brought in via ems from home with diff breathing.  Pt in no acute distress on arrival to triage.  No cough.  cig smoker.  No chest pain.  Pt states it hurts to take a deep breath. Pt alert.  Speech clear.  Skin warm and dry.

## 2015-10-16 NOTE — ED Notes (Signed)
Patient transported to Ultrasound 

## 2015-10-16 NOTE — ED Notes (Signed)
Lab called to add on lipase.

## 2015-10-16 NOTE — Discharge Instructions (Signed)
Acute Pancreatitis Acute pancreatitis is a disease in which the pancreas becomes suddenly inflamed. The pancreas is a large gland located behind your stomach. The pancreas produces enzymes that help digest food. The pancreas also releases the hormones glucagon and insulin that help regulate blood sugar. Damage to the pancreas occurs when the digestive enzymes from the pancreas are activated and begin attacking the pancreas before being released into the intestine. Most acute attacks last a couple of days and can cause serious complications. Some people become dehydrated and develop low blood pressure. In severe cases, bleeding into the pancreas can lead to shock and can be life-threatening. The lungs, heart, and kidneys may fail. CAUSES  Pancreatitis can happen to anyone. In some cases, the cause is unknown. Most cases are caused by:  Alcohol abuse.  Gallstones. Other less common causes are:  Certain medicines.  Exposure to certain chemicals.  Infection.  Damage caused by an accident (trauma).  Abdominal surgery. SYMPTOMS   Pain in the upper abdomen that may radiate to the back.  Tenderness and swelling of the abdomen.  Nausea and vomiting. DIAGNOSIS  Your caregiver will perform a physical exam. Blood and stool tests may be done to confirm the diagnosis. Imaging tests may also be done, such as X-rays, CT scans, or an ultrasound of the abdomen. TREATMENT  Treatment usually requires a stay in the hospital. Treatment may include:  Pain medicine.  Fluid replacement through an intravenous line (IV).  Placing a tube in the stomach to remove stomach contents and control vomiting.  Not eating for 3 or 4 days. This gives your pancreas a rest, because enzymes are not being produced that can cause further damage.  Antibiotic medicines if your condition is caused by an infection.  Surgery of the pancreas or gallbladder. HOME CARE INSTRUCTIONS   Follow the diet advised by your  caregiver. This may involve avoiding alcohol and decreasing the amount of fat in your diet.  Eat smaller, more frequent meals. This reduces the amount of digestive juices the pancreas produces.  Drink enough fluids to keep your urine clear or pale yellow.  Only take over-the-counter or prescription medicines as directed by your caregiver.  Avoid drinking alcohol if it caused your condition.  Do not smoke.  Get plenty of rest.  Check your blood sugar at home as directed by your caregiver.  Keep all follow-up appointments as directed by your caregiver. SEEK MEDICAL CARE IF:   You do not recover as quickly as expected.  You develop new or worsening symptoms.  You have persistent pain, weakness, or nausea.  You recover and then have another episode of pain. SEEK IMMEDIATE MEDICAL CARE IF:   You are unable to eat or keep fluids down.  Your pain becomes severe.  You have a fever or persistent symptoms for more than 2 to 3 days.  You have a fever and your symptoms suddenly get worse.  Your skin or the white part of your eyes turn yellow (jaundice).  You develop vomiting.  You feel dizzy, or you faint.  Your blood sugar is high (over 300 mg/dL). MAKE SURE YOU:   Understand these instructions.  Will watch your condition.  Will get help right away if you are not doing well or get worse.   This information is not intended to replace advice given to you by your health care provider. Make sure you discuss any questions you have with your health care provider.   Document Released: 08/25/2005 Document Revised: 02/24/2012  Document Reviewed: 12/04/2011 Elsevier Interactive Patient Education 2016 [employer].  Shortness of Breath Shortness of breath means you have trouble breathing. It could also mean that you have a medical problem. You should get immediate medical care for shortness of breath. CAUSES   Not enough oxygen in the air such as with high altitudes or a  smoke-filled room.  Certain lung diseases, infections, or problems.  Heart disease or conditions, such as angina or heart failure.  Low red blood cells (anemia).  Poor physical fitness, which can cause shortness of breath when you exercise.  Chest or back injuries or stiffness.  Being overweight.  Smoking.  Anxiety, which can make you feel like you are not getting enough air. DIAGNOSIS  Serious medical problems can often be found during your physical exam. Tests may also be done to determine why you are having shortness of breath. Tests may include:  Chest X-rays.  Lung function tests.  Blood tests.  An electrocardiogram (ECG).  An ambulatory electrocardiogram. An ambulatory ECG records your heartbeat patterns over a 24-hour period.  Exercise testing.  A transthoracic echocardiogram (TTE). During echocardiography, sound waves are used to evaluate how blood flows through your heart.  A transesophageal echocardiogram (TEE).  Imaging scans. Your health care provider may not be able to find a cause for your shortness of breath after your exam. In this case, it is important to have a follow-up exam with your health care provider as directed.  TREATMENT  Treatment for shortness of breath depends on the cause of your symptoms and can vary greatly. HOME CARE INSTRUCTIONS   Do not smoke. Smoking is a common cause of shortness of breath. If you smoke, ask for help to quit.  Avoid being around chemicals or things that may bother your breathing, such as paint fumes and dust.  Rest as needed. Slowly resume your usual activities.  If medicines were prescribed, take them as directed for the full length of time directed. This includes oxygen and any inhaled medicines.  Keep all follow-up appointments as directed by your health care provider. SEEK MEDICAL CARE IF:   Your condition does not improve in the time expected.  You have a hard time doing your normal activities even with  rest.  You have any new symptoms. SEEK IMMEDIATE MEDICAL CARE IF:   Your shortness of breath gets worse.  You feel light-headed, faint, or develop a cough not controlled with medicines.  You start coughing up blood.  You have pain with breathing.  You have chest pain or pain in your arms, shoulders, or abdomen.  You have a fever.  You are unable to walk up stairs or exercise the way you normally do. MAKE SURE YOU:  Understand these instructions.  Will watch your condition.  Will get help right away if you are not doing well or get worse.   This information is not intended to replace advice given to you by your health care provider. Make sure you discuss any questions you have with your health care provider.   Document Released: 05/20/2001 Document Revised: 08/30/2013 Document Reviewed: 11/10/2011 Elsevier Interactive Patient Education [employer]

## 2015-10-17 ENCOUNTER — Emergency Department
Admission: EM | Admit: 2015-10-17 | Discharge: 2015-10-17 | Disposition: A | Discharge status: Home / Self Care | Payer: Medicaid Other | Attending: Emergency Medicine | Admitting: Emergency Medicine

## 2015-10-17 ENCOUNTER — Encounter: Payer: Self-pay | Admitting: Emergency Medicine

## 2015-10-17 ENCOUNTER — Emergency Department: Payer: Medicaid Other

## 2015-10-17 DIAGNOSIS — R0689 Other abnormalities of breathing: Secondary | ICD-10-CM | POA: Insufficient documentation

## 2015-10-17 DIAGNOSIS — F17210 Nicotine dependence, cigarettes, uncomplicated: Secondary | ICD-10-CM | POA: Diagnosis not present

## 2015-10-17 DIAGNOSIS — R110 Nausea: Secondary | ICD-10-CM | POA: Insufficient documentation

## 2015-10-17 DIAGNOSIS — R069 Unspecified abnormalities of breathing: Secondary | ICD-10-CM

## 2015-10-17 DIAGNOSIS — R1013 Epigastric pain: Secondary | ICD-10-CM | POA: Diagnosis not present

## 2015-10-17 DIAGNOSIS — R0602 Shortness of breath: Secondary | ICD-10-CM | POA: Diagnosis present

## 2015-10-17 DIAGNOSIS — R05 Cough: Secondary | ICD-10-CM | POA: Insufficient documentation

## 2015-10-17 DIAGNOSIS — Z3202 Encounter for pregnancy test, result negative: Secondary | ICD-10-CM | POA: Diagnosis not present

## 2015-10-17 HISTORY — DX: Pneumothorax, unspecified: J93.9

## 2015-10-17 HISTORY — DX: Acute pancreatitis without necrosis or infection, unspecified: K85.90

## 2015-10-17 LAB — COMPREHENSIVE METABOLIC PANEL
ALBUMIN: 3.9 g/dL (ref 3.5–5.0)
ALK PHOS: 69 U/L (ref 38–126)
ALT: 10 U/L — ABNORMAL LOW (ref 14–54)
ANION GAP: 7 (ref 5–15)
AST: 13 U/L — ABNORMAL LOW (ref 15–41)
BILIRUBIN TOTAL: 0.8 mg/dL (ref 0.3–1.2)
BUN: 8 mg/dL (ref 6–20)
CALCIUM: 8.7 mg/dL — AB (ref 8.9–10.3)
CO2: 24 mmol/L (ref 22–32)
Chloride: 105 mmol/L (ref 101–111)
Creatinine, Ser: 0.88 mg/dL (ref 0.44–1.00)
GLUCOSE: 74 mg/dL (ref 65–99)
POTASSIUM: 3.3 mmol/L — AB (ref 3.5–5.1)
Sodium: 136 mmol/L (ref 135–145)
TOTAL PROTEIN: 7.1 g/dL (ref 6.5–8.1)

## 2015-10-17 LAB — URINALYSIS COMPLETE WITH MICROSCOPIC (ARMC ONLY)
BILIRUBIN URINE: NEGATIVE
Bacteria, UA: NONE SEEN
GLUCOSE, UA: NEGATIVE mg/dL
Hgb urine dipstick: NEGATIVE
LEUKOCYTES UA: NEGATIVE
Nitrite: NEGATIVE
Protein, ur: NEGATIVE mg/dL
Specific Gravity, Urine: 1.008 (ref 1.005–1.030)
pH: 6 (ref 5.0–8.0)

## 2015-10-17 LAB — CBC
HEMATOCRIT: 36.3 % (ref 35.0–47.0)
HEMOGLOBIN: 12.3 g/dL (ref 12.0–16.0)
MCH: 31 pg (ref 26.0–34.0)
MCHC: 33.8 g/dL (ref 32.0–36.0)
MCV: 91.6 fL (ref 80.0–100.0)
Platelets: 351 10*3/uL (ref 150–440)
RBC: 3.96 MIL/uL (ref 3.80–5.20)
RDW: 13.1 % (ref 11.5–14.5)
WBC: 9.4 10*3/uL (ref 3.6–11.0)

## 2015-10-17 LAB — LIPASE, BLOOD: Lipase: 36 U/L (ref 11–51)

## 2015-10-17 LAB — TROPONIN I: Troponin I: 0.03 ng/mL (ref <0.031)

## 2015-10-17 NOTE — ED Provider Notes (Signed)
[facility] Emergency Department Provider Note   ____________________________________________  Time seen: ~1840  I have reviewed the triage vital signs and the nursing notes.   HISTORY  Chief Complaint Shortness of Breath   History limited by: Not Limited   HPI Maureen [name] is a 35 y.o. female who presents to the emergency department today because of concerns for continued difficulty with breathing. The patient describes a sensation of normal breathing with occasional needs to take deep breaths. She states this is not how she normally breathes. She was evaluated in this emergency department yesterday morning for this symptom as well as some epigastric pain. She states that the pain is no longer present. She states she has been taking the nausea and pain medications that were prescribed to her but the shortness of breath has persisted. She denies any chest pain with this. She has had associated dry cough. No fevers.     Past Medical History  Diagnosis Date  . Pancreatitis   . Pneumothorax     There are no active problems to display for this patient.   Past Surgical History  Procedure Laterality Date  . Lung surgery      Current Outpatient Rx  Name  Route  Sig  Dispense  Refill  . ondansetron (ZOFRAN ODT) 4 MG disintegrating tablet   Oral   Take 1 tablet (4 mg total) by mouth every 8 (eight) hours as needed for nausea or vomiting.   20 tablet   0   . oxyCODONE-acetaminophen (ROXICET) 5-325 MG tablet   Oral   Take 1 tablet by mouth every 6 (six) hours as needed.   12 tablet   0     Allergies Review of patient's allergies indicates no known allergies.  No family history on file.  Social History Social History  Substance Use Topics  . Smoking status: Current Every Day Smoker -- 0.25 packs/day    Types: Cigarettes  . Smokeless tobacco: None  . Alcohol Use: No    Review of Systems  Constitutional: Negative for  fever. Cardiovascular: Negative for chest pain. Respiratory: Positive for shortness of breath and dry cough. Gastrointestinal: Negative for abdominal pain, vomiting and diarrhea. Neurological: Negative for headaches, focal weakness or numbness.   10-point ROS otherwise negative.  ____________________________________________   PHYSICAL EXAM:  VITAL SIGNS: ED Triage Vitals  Enc Vitals Group     BP 10/17/15 1638 128/86 mmHg     Pulse Rate 10/17/15 1638 68     Resp 10/17/15 1638 16     Temp 10/17/15 1638 98.7 F (37.1 C)     Temp Source 10/17/15 1638 Oral     SpO2 10/17/15 1638 96 %     Weight 10/17/15 1638 150 lb (68.04 kg)     Height 10/17/15 1638  (1.626 m)     Head Cir --      Peak Flow --      Pain Score 10/17/15 1639 0   Constitutional: Alert and oriented. Well appearing and in no distress. Eyes: Conjunctivae are normal. PERRL. Normal extraocular movements. ENT   Head: Normocephalic and atraumatic.   Nose: No congestion/rhinnorhea.   Mouth/Throat: Mucous membranes are moist.   Neck: No stridor. Hematological/Lymphatic/Immunilogical: No cervical lymphadenopathy. Cardiovascular: Normal rate, regular rhythm.  No murmurs, rubs, or gallops. Respiratory: Normal respiratory effort without tachypnea nor retractions. Breath sounds are clear and equal bilaterally. No wheezes/rales/rhonchi. Gastrointestinal: Soft and nontender. No distention. There is no CVA tenderness. Genitourinary: Deferred Musculoskeletal:  Normal range of motion in all extremities. No joint effusions.  No lower extremity tenderness nor edema. Neurologic:  Normal speech and language. No gross focal neurologic deficits are appreciated.  Skin:  Skin is warm, dry and intact. No rash noted. Psychiatric: Mood and affect are normal. Speech and behavior are normal. Patient exhibits appropriate insight and judgment.  ____________________________________________    LABS (pertinent  positives/negatives)  Labs Reviewed  COMPREHENSIVE METABOLIC PANEL - Abnormal; Notable for the following:    Potassium 3.3 (*)    Calcium 8.7 (*)    AST 13 (*)    ALT 10 (*)    All other components within normal limits  TROPONIN I  CBC     ____________________________________________   EKG  I, Phineas Semen, attending physician, personally viewed and interpreted this EKG  EKG Time: 1645 Rate: 62 Rhythm: normal sinus rhythm Axis: normal Intervals: qtc 383 QRS: narrow ST changes: no st elevation Impression: normal ekg ____________________________________________    RADIOLOGY  CXR IMPRESSION: No active cardiopulmonary disease.  I, Macayla Ekdahl, personally viewed and evaluated these images (plain radiographs) as part of my medical decision making. ____________________________________________   PROCEDURES  Procedure(s) performed: None  Critical Care performed: No  ____________________________________________   INITIAL IMPRESSION / ASSESSMENT AND PLAN / ED COURSE  Pertinent labs & imaging results that were available during my care of the patient were reviewed by me and considered in my medical decision making (see chart for details).  Patient presented to the emergency department because of concerns for continued shortness of breath. Patient was seen in the emergency department yesterday morning for symptoms of shortness of breath as well as abdominal pain. Yesterday the patient had a negative chest x-ray negative d-dimer and otherwise reassuring exam and workup except for a mildly elevated lipase. Today the lipase has normalized consistent with the patient's report that the pain has gotten better. This point patient remains without tachycardia. Chest x-ray today without any concerning findings. I discussed with patient importance of following up with primary care doctor.  ____________________________________________   FINAL CLINICAL IMPRESSION(S) / ED  DIAGNOSES  Final diagnoses:  Abnormal breathing     Phineas Semen, MD 10/17/15 2111

## 2015-10-17 NOTE — ED Notes (Signed)
Patient states when she woke up from her nap at three pm today she suddenly felt like she couldn't breathe well.  Patient states, "It's like I have to take a deep breath just to breathe right."

## 2015-10-17 NOTE — Discharge Instructions (Signed)
Please seek medical attention for any high fevers, chest pain, shortness of breath, change in behavior, persistent vomiting, bloody stool or any other new or concerning symptoms. ° ° °Shortness of Breath °Shortness of breath means you have trouble breathing. It could also mean that you have a medical problem. You should get immediate medical care for shortness of breath. °CAUSES  °· Not enough oxygen in the air such as with high altitudes or a smoke-filled room. °· Certain lung diseases, infections, or problems. °· Heart disease or conditions, such as angina or heart failure. °· Low red blood cells (anemia). °· Poor physical fitness, which can cause shortness of breath when you exercise. °· Chest or back injuries or stiffness. °· Being overweight. °· Smoking. °· Anxiety, which can make you feel like you are not getting enough air. °DIAGNOSIS  °Serious medical problems can often be found during your physical exam. Tests may also be done to determine why you are having shortness of breath. Tests may include: °· Chest X-rays. °· Lung function tests. °· Blood tests. °· An electrocardiogram (ECG). °· An ambulatory electrocardiogram. An ambulatory ECG records your heartbeat patterns over a 24-hour period. °· Exercise testing. °· A transthoracic echocardiogram (TTE). During echocardiography, sound waves are used to evaluate how blood flows through your heart. °· A transesophageal echocardiogram (TEE). °· Imaging scans. °Your health care provider may not be able to find a cause for your shortness of breath after your exam. In this case, it is important to have a follow-up exam with your health care provider as directed.  °TREATMENT  °Treatment for shortness of breath depends on the cause of your symptoms and can vary greatly. °HOME CARE INSTRUCTIONS  °· Do not smoke. Smoking is a common cause of shortness of breath. If you smoke, ask for help to quit. °· Avoid being around chemicals or things that may bother your breathing,  such as paint fumes and dust. °· Rest as needed. Slowly resume your usual activities. °· If medicines were prescribed, take them as directed for the full length of time directed. This includes oxygen and any inhaled medicines. °· Keep all follow-up appointments as directed by your health care provider. °SEEK MEDICAL CARE IF:  °· Your condition does not improve in the time expected. °· You have a hard time doing your normal activities even with rest. °· You have any new symptoms. °SEEK IMMEDIATE MEDICAL CARE IF:  °· Your shortness of breath gets worse. °· You feel light-headed, faint, or develop a cough not controlled with medicines. °· You start coughing up blood. °· You have pain with breathing. °· You have chest pain or pain in your arms, shoulders, or abdomen. °· You have a fever. °· You are unable to walk up stairs or exercise the way you normally do. °MAKE SURE YOU: °· Understand these instructions. °· Will watch your condition. °· Will get help right away if you are not doing well or get worse. °  °This information is not intended to replace advice given to you by your health care provider. Make sure you discuss any questions you have with your health care provider. °  °Document Released: 05/20/2001 Document Revised: 08/30/2013 Document Reviewed: 11/10/2011 °Elsevier Interactive Patient Education ©2016 [employer] Inc[employer] ° °

## 2015-10-17 NOTE — ED Notes (Signed)
Pt reports having SOB after waking from a nap. Pt is in no acute distress at this time

## 2015-10-17 NOTE — ED Notes (Signed)
UA Preg test results NEGATIVE.

## 2015-11-06 ENCOUNTER — Emergency Department
Admission: EM | Admit: 2015-11-06 | Discharge: 2015-11-06 | Disposition: A | Discharge status: Home / Self Care | Payer: Medicaid Other | Attending: Emergency Medicine | Admitting: Emergency Medicine

## 2015-11-06 ENCOUNTER — Encounter: Payer: Self-pay | Admitting: Emergency Medicine

## 2015-11-06 DIAGNOSIS — F17210 Nicotine dependence, cigarettes, uncomplicated: Secondary | ICD-10-CM | POA: Insufficient documentation

## 2015-11-06 DIAGNOSIS — R509 Fever, unspecified: Secondary | ICD-10-CM | POA: Diagnosis present

## 2015-11-06 DIAGNOSIS — B349 Viral infection, unspecified: Secondary | ICD-10-CM | POA: Diagnosis not present

## 2015-11-06 MED ORDER — PSEUDOEPH-BROMPHEN-DM 30-2-10 MG/5ML PO SYRP: 10.0000 mL | ORAL_SOLUTION | Freq: Four times a day (QID) | ORAL | Status: AC | PRN

## 2015-11-06 NOTE — ED Provider Notes (Signed)
[facility] Emergency Department Provider Note  ____________________________________________  Time seen: Approximately 9:12 PM  I have reviewed the triage vital signs and the nursing notes.   HISTORY  Chief Complaint Fever    HPI Maureen [name] is a 35 y.o. female , NAD, presents to the emergency department with 4 day history of flulike symptoms. Has had body aches, fevers, cough, congestion. Noted she was sweating and hot today which improved after taking Excedrin. Has taken some Robitussin with little relief. Notes she just started over-the-counter antipyretics yesterday. Denies abdominal pain, nausea, vomiting, diarrhea.   Past Medical History  Diagnosis Date  . Pancreatitis   . Pneumothorax     There are no active problems to display for this patient.   Past Surgical History  Procedure Laterality Date  . Lung surgery      Current Outpatient Rx  Name  Route  Sig  Dispense  Refill  . brompheniramine-pseudoephedrine-DM 30-2-10 MG/5ML syrup   Oral   Take 10 mLs by mouth 4 (four) times daily as needed.   200 mL   0   . ondansetron (ZOFRAN ODT) 4 MG disintegrating tablet   Oral   Take 1 tablet (4 mg total) by mouth every 8 (eight) hours as needed for nausea or vomiting.   20 tablet   0   . oxyCODONE-acetaminophen (ROXICET) 5-325 MG tablet   Oral   Take 1 tablet by mouth every 6 (six) hours as needed.   12 tablet   0     Allergies Review of patient's allergies indicates no known allergies.  No family history on file.  Social History Social History  Substance Use Topics  . Smoking status: Current Every Day Smoker -- 0.25 packs/day    Types: Cigarettes  . Smokeless tobacco: None  . Alcohol Use: No     Review of Systems  Constitutional: Positive fever/chills, fatigue Eyes: No visual changes. No discharge ENT: Positive nasal congestion. No sore throat, ear pain. Cardiovascular: No chest pain. Respiratory: Positive cough.  No shortness of breath. No wheezing.  Gastrointestinal: No abdominal pain.  No nausea, vomiting.  No diarrhea.   Musculoskeletal: Positive for general myalgias. Negative for back pain.  Skin: Negative for rash. Neurological: Negative for headaches, focal weakness or numbness. 10-point ROS otherwise negative.  ____________________________________________   PHYSICAL EXAM:  VITAL SIGNS: ED Triage Vitals  Enc Vitals Group     BP 11/06/15 1942 108/64 mmHg     Pulse Rate 11/06/15 1942 96     Resp 11/06/15 1942 20     Temp 11/06/15 1942 99.6 F (37.6 C)     Temp Source 11/06/15 1942 Oral     SpO2 11/06/15 1942 98 %     Weight 11/06/15 1942 145 lb (65.772 kg)     Height 11/06/15 1942  (1.651 m)     Head Cir --      Peak Flow --      Pain Score 11/06/15 1945 0     Pain Loc --      Pain Edu? --      Excl. in GC? --     Constitutional: Alert and oriented. Well appearing and in no acute distress. Eyes: Conjunctivae are normal. PERRL. EOMI without pain.  Head: Atraumatic. ENT:      Ears: TMs visualized bilaterally without erythema, bulging, effusion or perforation.      Nose: No congestion/rhinnorhea.      Mouth/Throat: Mucous membranes are moist. Pharynx without erythema,  swelling, exudate. Trace postnasal drip. Neck: No stridor. No cervical spine tenderness to palpation. Supple with full range of motion. Hematological/Lymphatic/Immunilogical: No cervical lymphadenopathy. Cardiovascular: Normal rate, regular rhythm. Normal S1 and S2.  Good peripheral circulation. Respiratory: Normal respiratory effort without tachypnea or retractions. Lungs CTAB. Neurologic:  Normal speech and language. No gross focal neurologic deficits are appreciated.  Skin:  Skin is warm, dry and intact. No rash noted. Psychiatric: Mood and affect are normal. Speech and behavior are normal. Patient exhibits appropriate insight and judgement.   ____________________________________________    LABS  None ____________________________________________  EKG  None ____________________________________________  RADIOLOGY  None ____________________________________________    PROCEDURES  Procedure(s) performed: None    Medications - No data to display   ____________________________________________   INITIAL IMPRESSION / ASSESSMENT AND PLAN / ED COURSE  Patient's diagnosis is consistent with viral infection. Discussed with patient that testing and treatment for influenza is best within 48 hours and at this time, testing nor treatment with Tamiflu would be beneficial for her. Patient will be discharged home with prescriptions for Bromfed-DM and instructions to take Tylenol or Motrin as needed for fever/aches. Patient is to follow up with [facility] if symptoms persist past this treatment course. Patient is given ED precautions to return to the ED for any worsening or new symptoms.      ____________________________________________  FINAL CLINICAL IMPRESSION(S) / ED DIAGNOSES  Final diagnoses:  Viral infection      NEW MEDICATIONS STARTED DURING THIS VISIT:  New Prescriptions   BROMPHENIRAMINE-PSEUDOEPHEDRINE-DM 30-2-10 MG/5ML SYRUP    Take 10 mLs by mouth 4 (four) times daily as needed.         Hope Pigeon, PA-C 11/06/15 2135  Rockne Menghini, MD 11/07/15 0009

## 2015-11-06 NOTE — Discharge Instructions (Signed)
Viral Infections °A viral infection can be caused by different types of viruses. Most viral infections are not serious and resolve on their own. However, some infections may cause severe symptoms and may lead to further complications. °SYMPTOMS °Viruses can frequently cause: °· Minor sore throat. °· Aches and pains. °· Headaches. °· Runny nose. °· Different types of rashes. °· Watery eyes. °· Tiredness. °· Cough. °· Loss of appetite. °· Gastrointestinal infections, resulting in nausea, vomiting, and diarrhea. °These symptoms do not respond to antibiotics because the infection is not caused by bacteria. However, you might catch a bacterial infection following the viral infection. This is sometimes called a "superinfection." Symptoms of such a bacterial infection may include: °· Worsening sore throat with pus and difficulty swallowing. °· Swollen neck glands. °· Chills and a high or persistent fever. °· Severe headache. °· Tenderness over the sinuses. °· Persistent overall ill feeling (malaise), muscle aches, and tiredness (fatigue). °· Persistent cough. °· Yellow, green, or brown mucus production with coughing. °HOME CARE INSTRUCTIONS  °· Only take over-the-counter or prescription medicines for pain, discomfort, diarrhea, or fever as directed by your caregiver. °· Drink enough water and fluids to keep your urine clear or pale yellow. Sports drinks can provide valuable electrolytes, sugars, and hydration. °· Get plenty of rest and maintain proper nutrition. Soups and broths with crackers or rice are fine. °SEEK IMMEDIATE MEDICAL CARE IF:  °· You have severe headaches, shortness of breath, chest pain, neck pain, or an unusual rash. °· You have uncontrolled vomiting, diarrhea, or you are unable to keep down fluids. °· You or your child has an oral temperature above 102° F (38.9° C), not controlled by medicine. °· Your baby is older than 3 months with a rectal temperature of 102° F (38.9° C) or higher. °· Your baby is 3  months old or younger with a rectal temperature of 100.4° F (38° C) or higher. °MAKE SURE YOU:  °· Understand these instructions. °· Will watch your condition. °· Will get help right away if you are not doing well or get worse. °  °This information is not intended to replace advice given to you by your health care provider. Make sure you discuss any questions you have with your health care provider. °  °Document Released: 06/04/2005 Document Revised: 11/17/2011 Document Reviewed: 01/31/2015 °[employer]vier Interactive Patient Education ©2016 [employer] ° °

## 2015-11-06 NOTE — ED Notes (Signed)
C/O feeling sick with flu like symptoms on Friday.  Describes fevers since Friday.  ONly medication taken today was excedrin at 1700.

## 2015-11-06 NOTE — ED Notes (Addendum)
Flu like symptoms since Friday. Pt alert and oriented X4, active, cooperative, pt in NAD. RR even and unlabored, color WNL.

## 2015-11-06 NOTE — ED Notes (Signed)
Pt alert and oriented X4, active, cooperative, pt in NAD. RR even and unlabored, color WNL.  Pt informed to return if any life threatening symptoms occur.   

## 2016-05-06 IMAGING — US US ABDOMEN LIMITED
1 series · 14 of 25 positions shown · non-contrast
Comparison: 05/01/2012

CLINICAL DATA: Abdominal pain for 1 day.

EXAM:
US ABDOMEN LIMITED - RIGHT UPPER QUADRANT

[Series 1: us abdomen limited · 0.15mm/px · 14 of 45 slices shown]
[im 1/45]
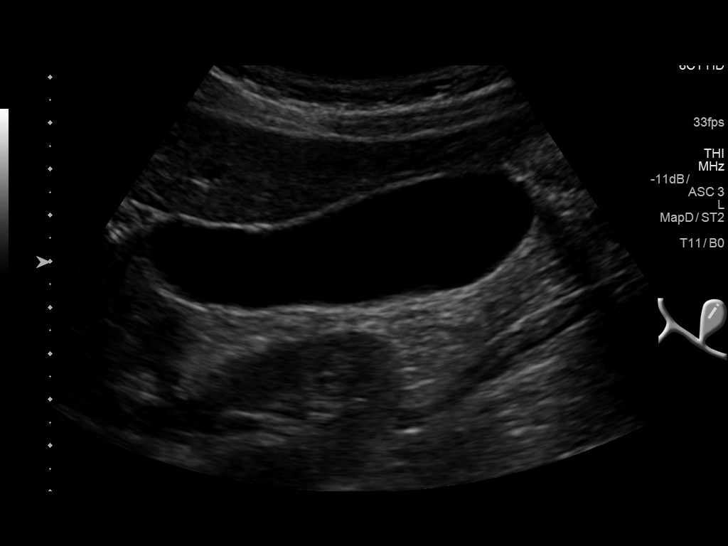
[im 4/45]
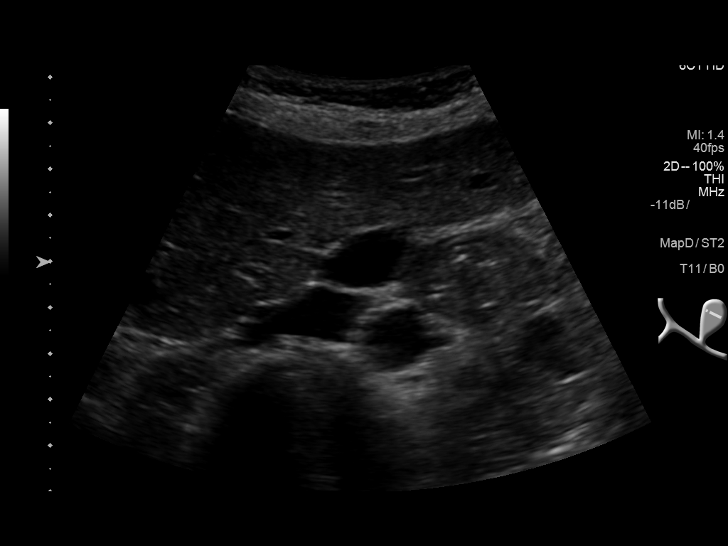
[im 8/45]
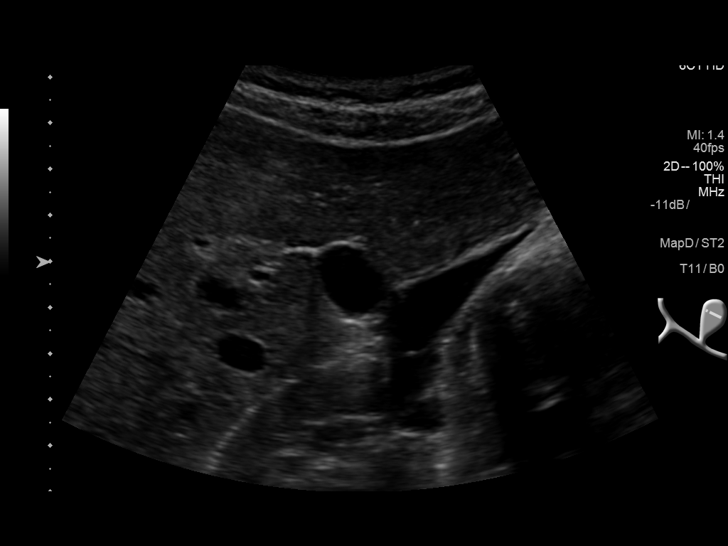
[im 12/45]
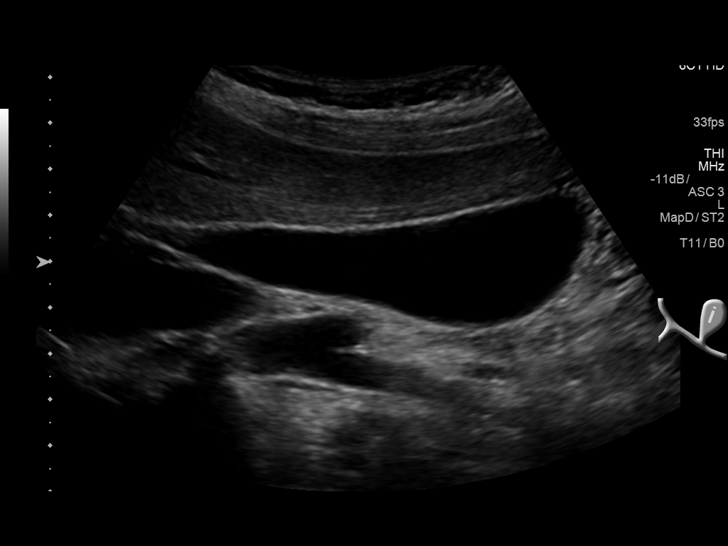
[im 15/45]
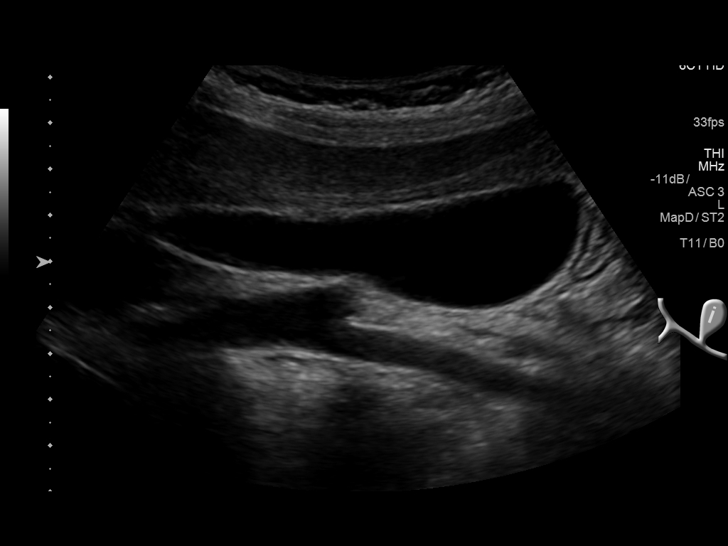
[im 17/45]
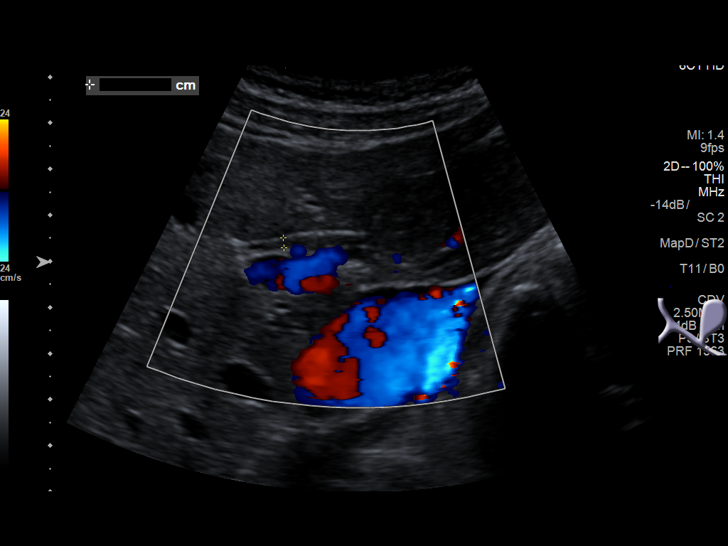
[im 21/45]
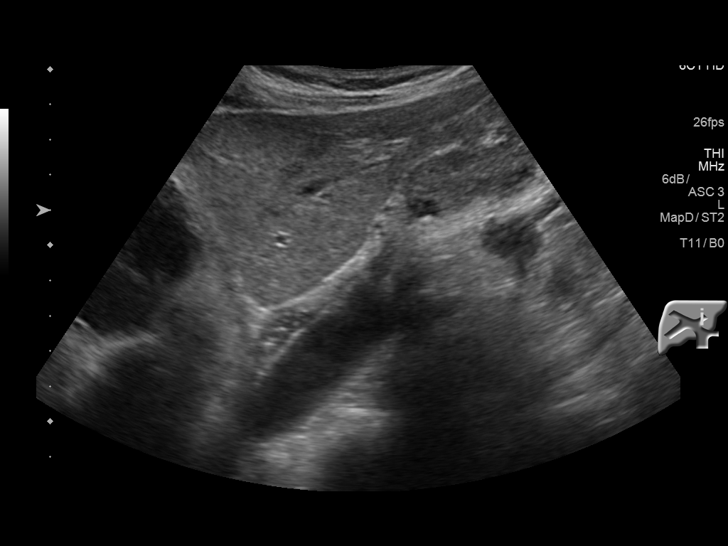
[im 24/45]
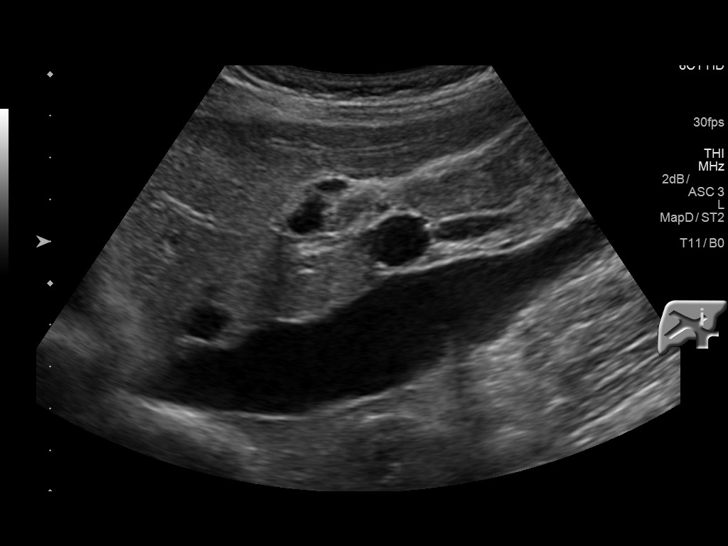
[im 28/45]
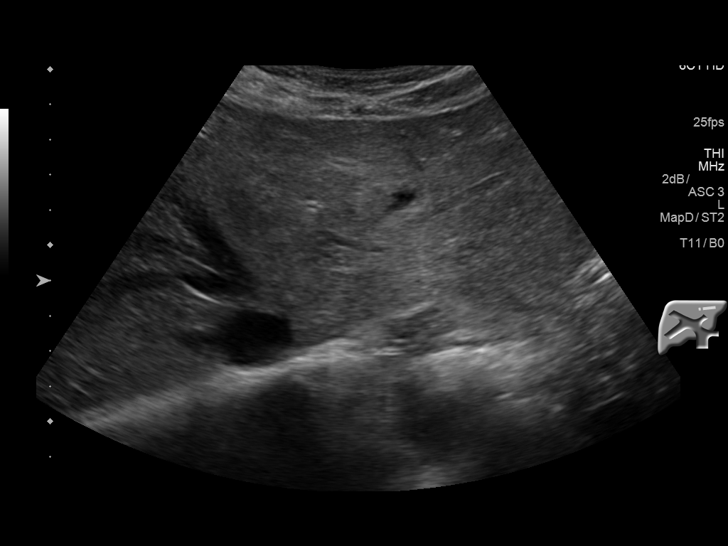
[im 30/45]
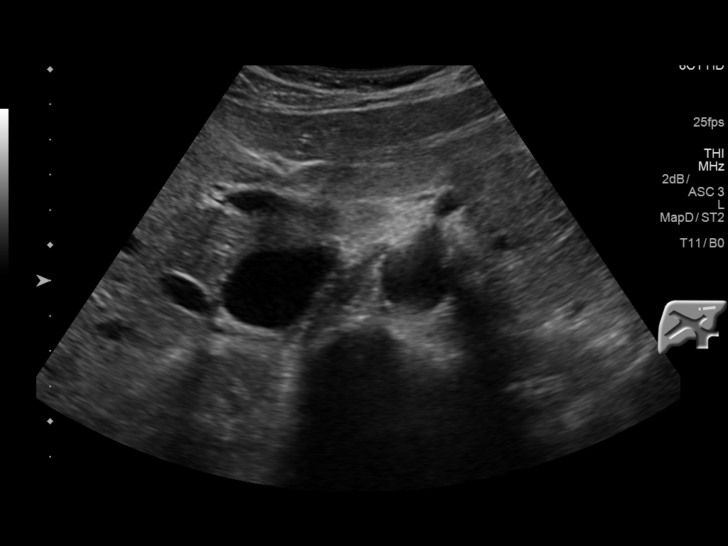
[im 34/45]
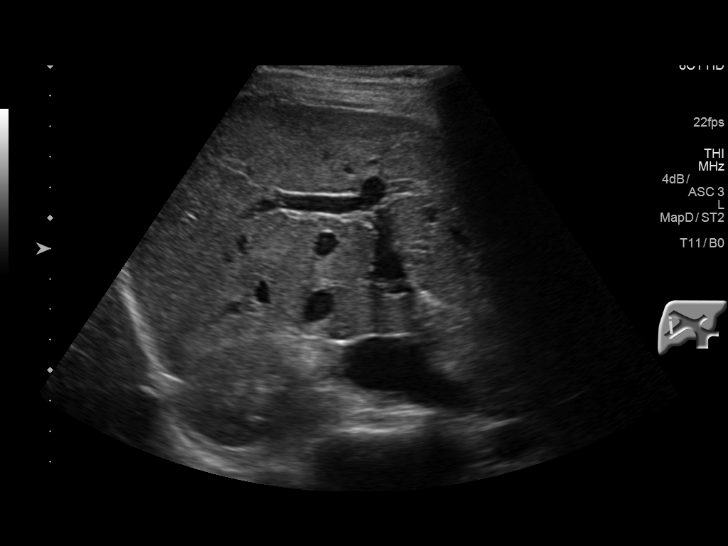
[im 37/45]
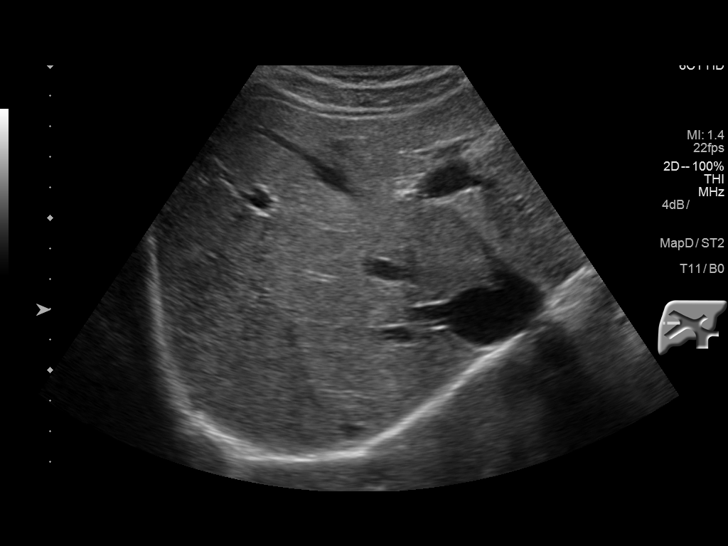
[im 41/45]
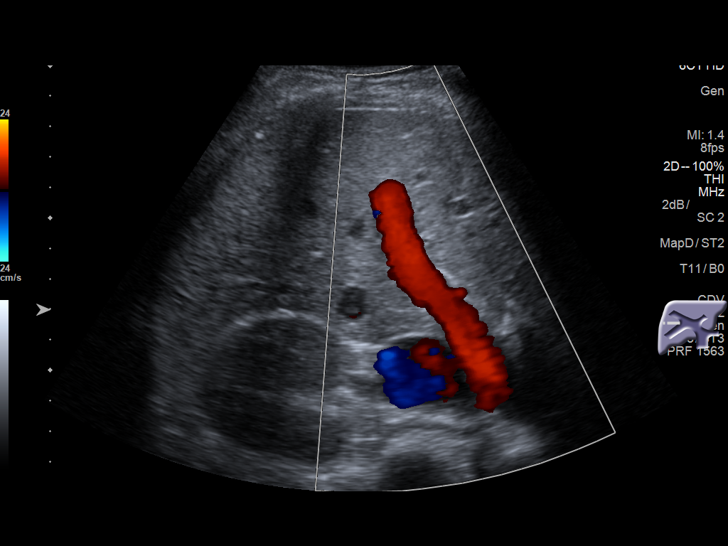
[im 45/45]
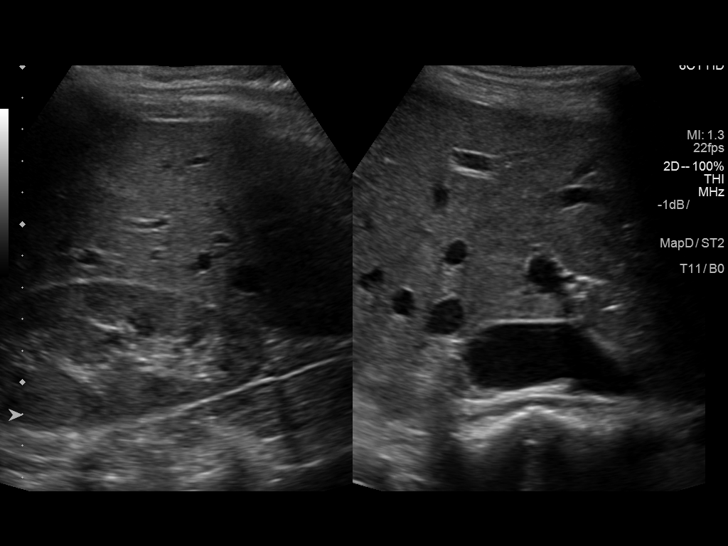

[14 of 25 positions shown; findings below may reference images not displayed]

FINDINGS: Gallbladder:

No gallstones or wall thickening visualized. No sonographic Murphy
sign noted by sonographer.

Common bile duct:

Diameter: 2.2 mm

Liver:

No focal lesion identified. Within normal limits in parenchymal
echogenicity.
IMPRESSION: Normal right upper quadrant ultrasound.

## 2017-10-30 IMAGING — CR DG CHEST 2V
2 series · 2 of 2 positions shown · non-contrast
Comparison: 10/16/2015

CLINICAL DATA: Shortness of breath, dry cough for 2 days,
pneumothorax 3 years ago

EXAM:
CHEST  2 VIEW

[chest pa]
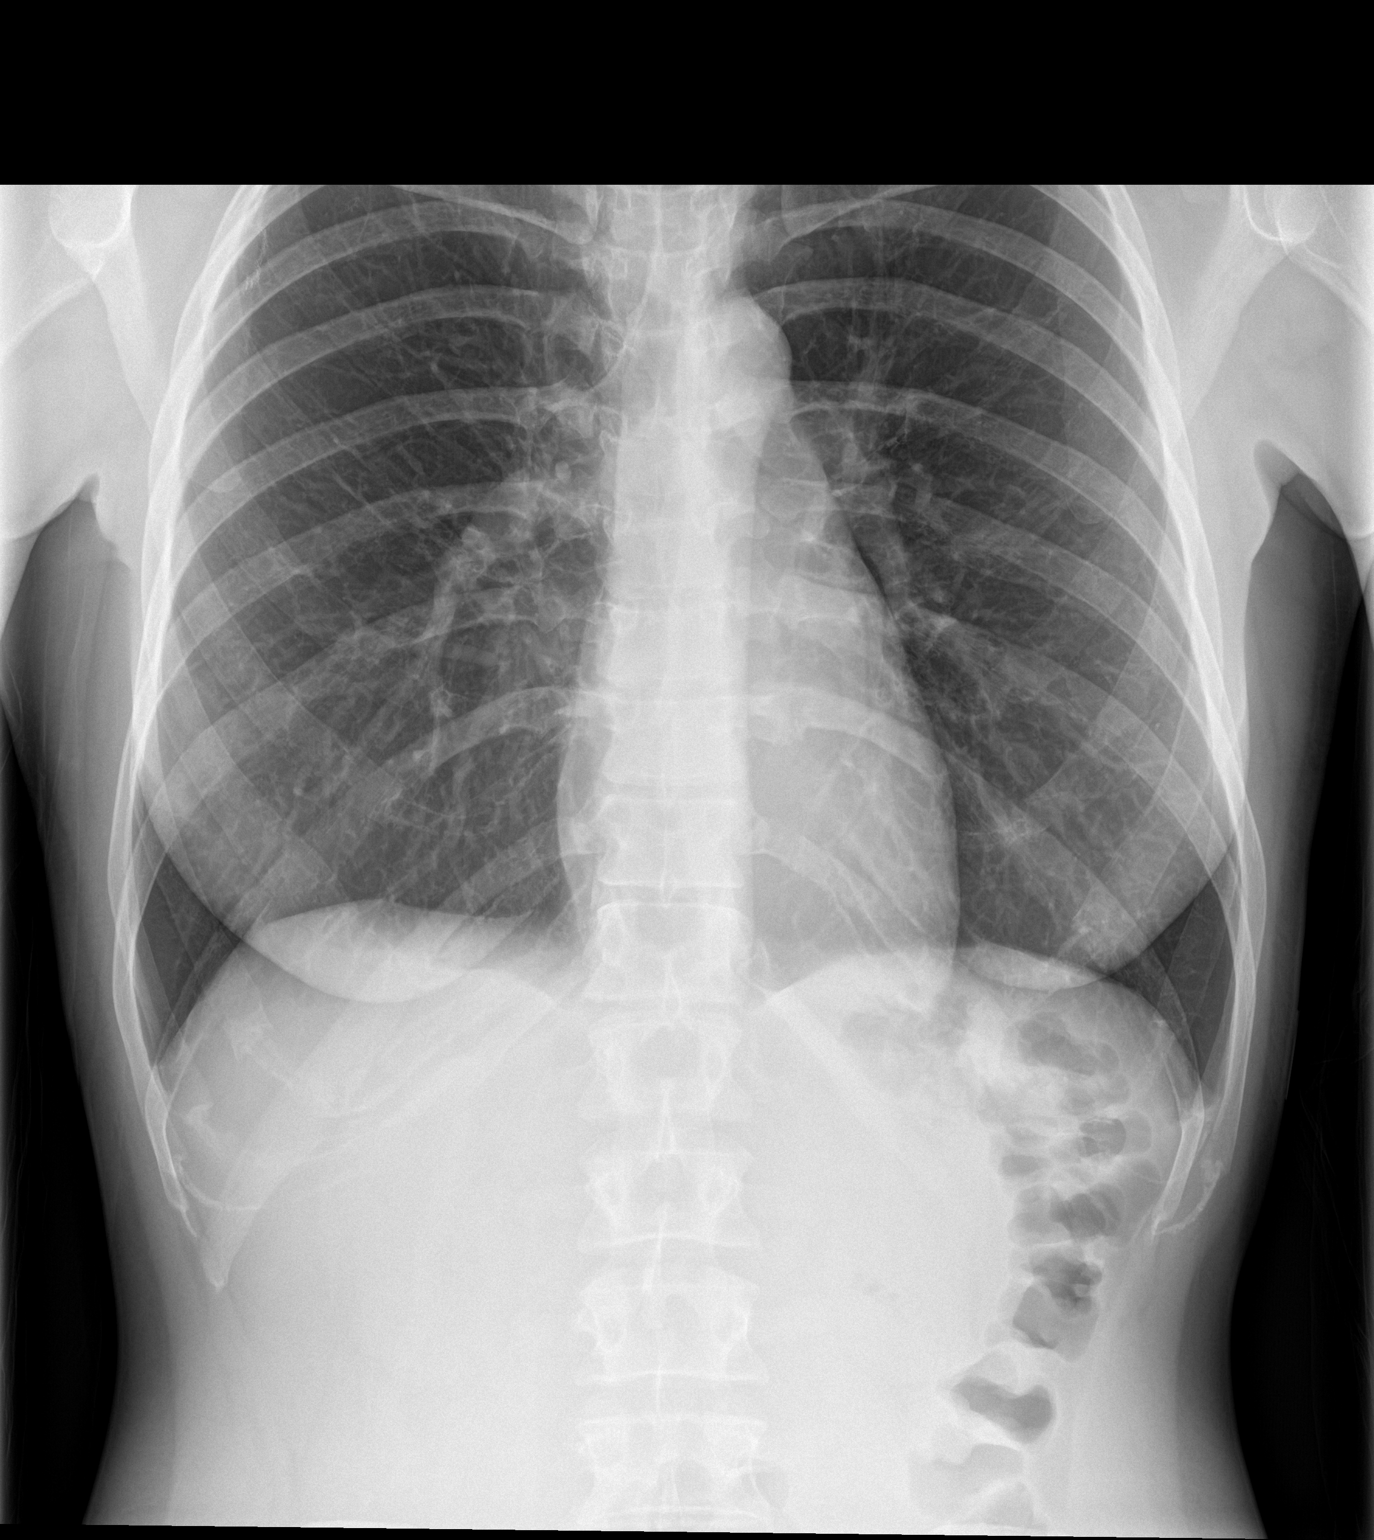

[chest lat]
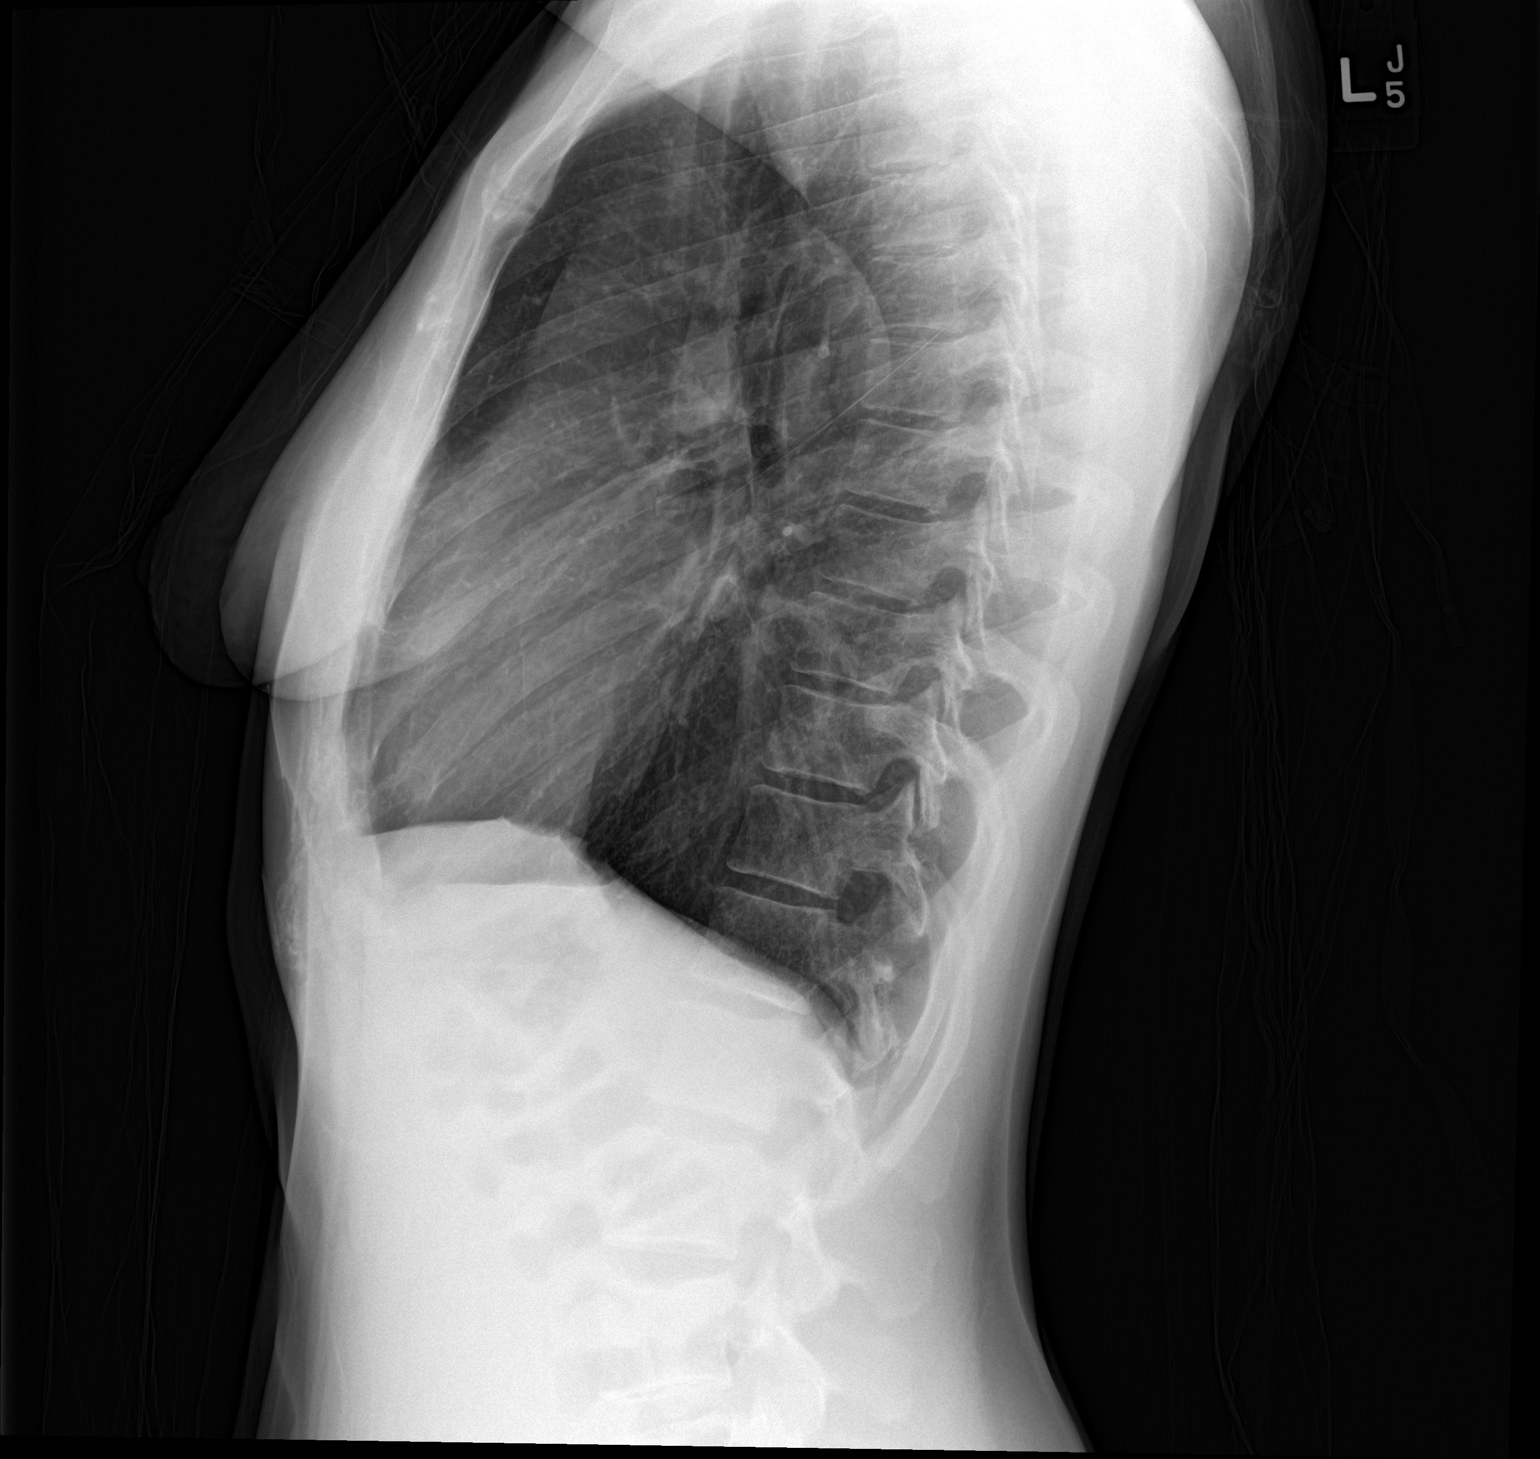

[2 of 2 positions shown; findings below may reference images not displayed]

FINDINGS: The heart size and mediastinal contours are within normal limits.
Both lungs are clear. The visualized skeletal structures are
unremarkable.
IMPRESSION: No active cardiopulmonary disease.
